# Patient Record
Sex: Female | Born: 1945 | Race: White | Hispanic: No | Marital: Married
Health system: Southern US, Community
[De-identification: ages and names within clinical notes are randomized; demographics above are authoritative.]

---

## 2000-12-23 ENCOUNTER — Other Ambulatory Visit: Admission: RE | Admit: 2000-12-23 | Discharge: 2000-12-23 | Payer: Self-pay | Admitting: Obstetrics and Gynecology

## 2000-12-25 ENCOUNTER — Ambulatory Visit (HOSPITAL_COMMUNITY): Admission: RE | Admit: 2000-12-25 | Discharge: 2000-12-25 | Payer: Self-pay | Admitting: Obstetrics and Gynecology

## 2000-12-25 ENCOUNTER — Encounter: Payer: Self-pay | Admitting: Obstetrics and Gynecology

## 2001-12-28 ENCOUNTER — Ambulatory Visit (HOSPITAL_COMMUNITY): Admission: RE | Admit: 2001-12-28 | Discharge: 2001-12-28 | Payer: Self-pay | Admitting: Obstetrics and Gynecology

## 2001-12-28 ENCOUNTER — Encounter: Payer: Self-pay | Admitting: Obstetrics and Gynecology

## 2001-12-30 ENCOUNTER — Encounter: Payer: Self-pay | Admitting: Obstetrics and Gynecology

## 2001-12-30 ENCOUNTER — Ambulatory Visit (HOSPITAL_COMMUNITY): Admission: RE | Admit: 2001-12-30 | Discharge: 2001-12-30 | Payer: Self-pay | Admitting: Obstetrics and Gynecology

## 2002-07-18 ENCOUNTER — Ambulatory Visit (HOSPITAL_COMMUNITY): Admission: RE | Admit: 2002-07-18 | Discharge: 2002-07-18 | Payer: Self-pay | Admitting: Obstetrics and Gynecology

## 2002-07-18 ENCOUNTER — Encounter: Payer: Self-pay | Admitting: Obstetrics and Gynecology

## 2003-01-18 ENCOUNTER — Encounter: Payer: Self-pay | Admitting: Obstetrics and Gynecology

## 2003-01-18 ENCOUNTER — Ambulatory Visit (HOSPITAL_COMMUNITY): Admission: RE | Admit: 2003-01-18 | Discharge: 2003-01-18 | Payer: Self-pay | Admitting: Obstetrics and Gynecology

## 2004-03-14 ENCOUNTER — Ambulatory Visit (HOSPITAL_COMMUNITY): Admission: RE | Admit: 2004-03-14 | Discharge: 2004-03-14 | Payer: Self-pay | Admitting: Obstetrics and Gynecology

## 2005-03-20 ENCOUNTER — Ambulatory Visit (HOSPITAL_COMMUNITY): Admission: RE | Admit: 2005-03-20 | Discharge: 2005-03-20 | Payer: Self-pay | Admitting: Vascular Surgery

## 2006-04-17 ENCOUNTER — Ambulatory Visit (HOSPITAL_COMMUNITY): Admission: RE | Admit: 2006-04-17 | Discharge: 2006-04-17 | Payer: Self-pay | Admitting: Obstetrics and Gynecology

## 2007-05-11 ENCOUNTER — Ambulatory Visit (HOSPITAL_COMMUNITY): Admission: RE | Admit: 2007-05-11 | Discharge: 2007-05-11 | Payer: Self-pay | Admitting: Obstetrics and Gynecology

## 2008-05-11 ENCOUNTER — Ambulatory Visit (HOSPITAL_COMMUNITY): Admission: RE | Admit: 2008-05-11 | Discharge: 2008-05-11 | Payer: Self-pay | Admitting: Obstetrics and Gynecology

## 2008-05-18 ENCOUNTER — Encounter: Admission: RE | Admit: 2008-05-18 | Discharge: 2008-05-18 | Payer: Self-pay | Admitting: Obstetrics and Gynecology

## 2009-05-21 ENCOUNTER — Ambulatory Visit (HOSPITAL_COMMUNITY): Admission: RE | Admit: 2009-05-21 | Discharge: 2009-05-21 | Payer: Self-pay | Admitting: Obstetrics and Gynecology

## 2010-06-04 ENCOUNTER — Ambulatory Visit (HOSPITAL_COMMUNITY): Admission: RE | Admit: 2010-06-04 | Discharge: 2010-06-04 | Payer: Self-pay | Admitting: Obstetrics and Gynecology

## 2010-09-30 ENCOUNTER — Encounter: Payer: Self-pay | Admitting: Obstetrics and Gynecology

## 2011-01-11 ENCOUNTER — Emergency Department (HOSPITAL_COMMUNITY): Payer: BC Managed Care – PPO

## 2011-01-11 ENCOUNTER — Observation Stay (HOSPITAL_COMMUNITY)
Admission: EM | Admit: 2011-01-11 | Discharge: 2011-01-14 | DRG: 395 | Disposition: A | Discharge status: Home / Self Care | Payer: BC Managed Care – PPO | Attending: Internal Medicine | Admitting: Internal Medicine

## 2011-01-11 DIAGNOSIS — R609 Edema, unspecified: Secondary | ICD-10-CM | POA: Insufficient documentation

## 2011-01-11 DIAGNOSIS — K2970 Gastritis, unspecified, without bleeding: Secondary | ICD-10-CM | POA: Insufficient documentation

## 2011-01-11 DIAGNOSIS — R259 Unspecified abnormal involuntary movements: Secondary | ICD-10-CM | POA: Insufficient documentation

## 2011-01-11 DIAGNOSIS — I498 Other specified cardiac arrhythmias: Secondary | ICD-10-CM | POA: Insufficient documentation

## 2011-01-11 DIAGNOSIS — K2990 Gastroduodenitis, unspecified, without bleeding: Secondary | ICD-10-CM | POA: Insufficient documentation

## 2011-01-11 DIAGNOSIS — D649 Anemia, unspecified: Secondary | ICD-10-CM

## 2011-01-11 DIAGNOSIS — K648 Other hemorrhoids: Secondary | ICD-10-CM | POA: Insufficient documentation

## 2011-01-11 DIAGNOSIS — R0602 Shortness of breath: Secondary | ICD-10-CM | POA: Insufficient documentation

## 2011-01-11 DIAGNOSIS — R002 Palpitations: Secondary | ICD-10-CM | POA: Insufficient documentation

## 2011-01-11 DIAGNOSIS — D509 Iron deficiency anemia, unspecified: Principal | ICD-10-CM | POA: Insufficient documentation

## 2011-01-11 LAB — LACTATE DEHYDROGENASE: LDH: 200 U/L (ref 94–250)

## 2011-01-11 LAB — HEPATIC FUNCTION PANEL
Albumin: 4.2 g/dL (ref 3.5–5.2)
Indirect Bilirubin: 0.2 mg/dL — ABNORMAL LOW (ref 0.3–0.9)
Total Bilirubin: 0.3 mg/dL (ref 0.3–1.2)

## 2011-01-11 LAB — BASIC METABOLIC PANEL
Calcium: 9.8 mg/dL (ref 8.4–10.5)
Creatinine, Ser: 0.49 mg/dL (ref 0.4–1.2)
GFR calc Af Amer: >60 mL/min (ref >60)

## 2011-01-11 LAB — CBC
MCH: 15.1 pg — ABNORMAL LOW (ref 26.0–34.0)
MCHC: 26 g/dL — ABNORMAL LOW (ref 30.0–36.0)
Platelets: 562 10*3/uL — ABNORMAL HIGH (ref 150–400)
RDW: 21.6 % — ABNORMAL HIGH (ref 11.5–15.5)

## 2011-01-11 LAB — DIFFERENTIAL
Basophils Relative: 1 % (ref 0–1)
Eosinophils Absolute: 0.1 10*3/uL (ref 0.0–0.7)
Eosinophils Relative: 1 % (ref 0–5)
Monocytes Absolute: 0.6 10*3/uL (ref 0.1–1.0)
Monocytes Relative: 8 % (ref 3–12)
Neutrophils Relative %: 68 % (ref 43–77)

## 2011-01-11 LAB — MAGNESIUM: Magnesium: 2.5 mg/dL (ref 1.5–2.5)

## 2011-01-11 LAB — POCT CARDIAC MARKERS: Myoglobin, poc: 25.5 ng/mL (ref 12–200)

## 2011-01-12 LAB — IRON AND TIBC

## 2011-01-12 LAB — HAPTOGLOBIN: Haptoglobin: 104 mg/dL (ref 16–200)

## 2011-01-12 LAB — DIFFERENTIAL
Basophils Absolute: 0.1 10*3/uL (ref 0.0–0.1)
Basophils Relative: 1 % (ref 0–1)
Eosinophils Absolute: 0.1 10*3/uL (ref 0.0–0.7)
Eosinophils Relative: 3 % (ref 0–5)
Lymphocytes Relative: 33 % (ref 12–46)
Monocytes Absolute: 0.4 10*3/uL (ref 0.1–1.0)

## 2011-01-12 LAB — CBC
HCT: 32.4 % — ABNORMAL LOW (ref 36.0–46.0)
MCHC: 29 g/dL — ABNORMAL LOW (ref 30.0–36.0)
Platelets: 499 10*3/uL — ABNORMAL HIGH (ref 150–400)
RDW: 25.1 % — ABNORMAL HIGH (ref 11.5–15.5)
WBC: 4.9 10*3/uL (ref 4.0–10.5)

## 2011-01-12 LAB — BASIC METABOLIC PANEL
Calcium: 9.9 mg/dL (ref 8.4–10.5)
GFR calc Af Amer: >60 mL/min (ref >60)
GFR calc non Af Amer: >60 mL/min (ref >60)
Potassium: 3.9 mEq/L (ref 3.5–5.1)
Sodium: 140 mEq/L (ref 135–145)

## 2011-01-12 LAB — CK TOTAL AND CKMB (NOT AT ARMC)
CK, MB: 1.4 ng/mL (ref 0.3–4.0)
Relative Index: INVALID (ref 0.0–2.5)
Total CK: 53 U/L (ref 7–177)

## 2011-01-12 LAB — FERRITIN: Ferritin: 1 ng/mL — ABNORMAL LOW (ref 10–291)

## 2011-01-12 LAB — TSH: TSH: 2.216 u[IU]/mL (ref 0.350–4.500)

## 2011-01-13 ENCOUNTER — Other Ambulatory Visit: Payer: Self-pay | Admitting: Gastroenterology

## 2011-01-13 DIAGNOSIS — K648 Other hemorrhoids: Secondary | ICD-10-CM

## 2011-01-13 DIAGNOSIS — R002 Palpitations: Secondary | ICD-10-CM

## 2011-01-13 DIAGNOSIS — K2940 Chronic atrophic gastritis without bleeding: Secondary | ICD-10-CM

## 2011-01-13 DIAGNOSIS — D509 Iron deficiency anemia, unspecified: Secondary | ICD-10-CM

## 2011-01-13 LAB — CBC
MCV: 63.7 fL — ABNORMAL LOW (ref 78.0–100.0)
Platelets: 434 10*3/uL — ABNORMAL HIGH (ref 150–400)
RBC: 4.91 MIL/uL (ref 3.87–5.11)
RDW: 25.5 % — ABNORMAL HIGH (ref 11.5–15.5)
WBC: 4.6 10*3/uL (ref 4.0–10.5)

## 2011-01-13 LAB — MAGNESIUM: Magnesium: 2.1 mg/dL (ref 1.5–2.5)

## 2011-01-13 LAB — BASIC METABOLIC PANEL
Chloride: 103 mEq/L (ref 96–112)
Potassium: 3.8 mEq/L (ref 3.5–5.1)
Sodium: 138 mEq/L (ref 135–145)

## 2011-01-13 LAB — DIFFERENTIAL
Basophils Absolute: 0.1 10*3/uL (ref 0.0–0.1)
Basophils Relative: 1 % (ref 0–1)
Eosinophils Absolute: 0.1 10*3/uL (ref 0.0–0.7)
Eosinophils Relative: 2 % (ref 0–5)
Lymphs Abs: 1.5 10*3/uL (ref 0.7–4.0)
Neutrophils Relative %: 52 % (ref 43–77)

## 2011-01-13 LAB — HEMOCCULT GUIAC POC 1CARD (OFFICE): Fecal Occult Bld: NEGATIVE

## 2011-01-13 LAB — TROPONIN I: Troponin I: <0.3 ng/mL (ref <0.30)

## 2011-01-14 LAB — LIPID PANEL
Cholesterol: 156 mg/dL (ref 0–200)
LDL Cholesterol: 69 mg/dL (ref 0–99)
Triglycerides: 43 mg/dL (ref <150)
VLDL: 9 mg/dL (ref 0–40)

## 2011-01-15 LAB — CROSSMATCH
ABO/RH(D): A POS
Antibody Screen: NEGATIVE
Unit division: 0

## 2011-01-16 NOTE — Progress Notes (Signed)
Cc to Dr. Ferguson 

## 2011-01-17 ENCOUNTER — Encounter: Payer: BC Managed Care – PPO | Admitting: Gastroenterology

## 2011-01-17 ENCOUNTER — Ambulatory Visit (HOSPITAL_COMMUNITY)
Admission: RE | Admit: 2011-01-17 | Discharge: 2011-01-17 | Disposition: A | Discharge status: Home / Self Care | Payer: BC Managed Care – PPO | Source: Ambulatory Visit | Attending: Gastroenterology | Admitting: Gastroenterology

## 2011-01-17 DIAGNOSIS — D509 Iron deficiency anemia, unspecified: Secondary | ICD-10-CM | POA: Insufficient documentation

## 2011-01-17 DIAGNOSIS — K633 Ulcer of intestine: Secondary | ICD-10-CM | POA: Insufficient documentation

## 2011-01-17 NOTE — Progress Notes (Signed)
No PCP on record 

## 2011-01-17 NOTE — H&P (Signed)
[name], Farmer NO.:  [id number]  MEDICAL RECORD NO.:  [id number]           PATIENT TYPE:  O  LOCATION:  A219                          FACILITY:  [facility]  PHYSICIAN:  Vania Rea, M.D. DATE OF BIRTH:  May 08, 1946  DATE OF ADMISSION:  01/11/2011 DATE OF DISCHARGE:  LH                             HISTORY & PHYSICAL   PRIMARY CARE PHYSICIAN:  Unassigned.  OBSTETRIC AND GYNECOLOGIC DOCTOR:  Vanessa P. Haygood, MD  CHIEF COMPLAINT:  Palpitations with exertion, episodically for 1 year, much worse today.  HISTORY OF PRESENT ILLNESS:  This is a 65 year old Caucasian lady who considers herself to be in very good health.  Does aerobic exercises for about an hour each day, but has not had any medical followup for about 35 years.  The patient says she had a partial thyroidectomy 35 years ago at [facility] and that is the last time when a blood work has been done on her.  She does however get regular Pap smears and regular mammograms, all of which have been negative.  She denies any history of anemia.  The patient says she first noticed severe palpitations while hiking in [place] about May 2011 and noticed it going up an incline which she thought was strange.  She never has any unusual shortness of breath or palpitations during her aerobic exercises.  For the past 2 weeks, she has noticed palpitations and lightheadedness when climbing stairs but not when walking on an incline and today at a birthday party in [place], the symptoms got much worse and she decided to come to the local emergency room to be evaluated.  She has not noticed any orthopnea, but she has been having episodic lower extremity edema for at least a few months.  It was very bad for about 3 days 2 weeks ago, but she felt it was due to a water deficiency. Drank a lot of water and it appeared to go away.  She has had no frank shortness of breath.  She has had no chest pain.  She denies any  nausea or vomiting.  She denies passage of bloody or black stool.  For the past year, she has been taking two 325 mg aspirins every morning before exercise and about 6 months ago, she suffered a fracture of the foot and took Aleve for 6 weeks.  That foot fracture was managed out at [facility] in [place] and [facility] in [place], but no blood work was necessary for successful management.  The patient reports that she deliberately lost 60 pounds 3 years ago using diet and exercise and her weight has been pretty steady since except, but over the past 6-8 months she noticed she has gained about 10 pounds.  PAST MEDICAL HISTORY:  Status post thyroidectomy as noted above.  MEDICATIONS:  Aspirin 650 mg daily.  ALLERGIES:  To PENICILLIN.  SOCIAL HISTORY:  She denies tobacco, alcohol or illicit drug use. Exercises for an hour daily.  She is self-employed, Marine scientist.  She is menopause at about age [age] and has had no postmenopausal bleeding.  FAMILY HISTORY:  Significant for mother  and sister with breast cancer. Her mother was diagnosed at age 97 and died at age 23.  Her father had some sorts of aortic stenting because of blockage of the aorta.  The details are unclear.  He lived to be age 53.  No other known medical problems.  She has a brother who is a retired Dispensing optician who suffers with hypertension.  REVIEW OF SYSTEMS:  Other than noted above, unremarkable.  PHYSICAL EXAMINATION:  GENERAL:  A very pleasant and healthy-looking middle-aged Caucasian lady reclining in the bed not distressed.  Healthy appearance, probably enhance by the fact that she is evenly toned. VITAL SIGNS:  Her temperature is 98.1, pulse 68, respirations 20, blood pressure 129/55.  She is saturating 100% on 2 L. HEENT:  Her pupils are round equal and reactive.  Mucous membranes are pale.  Anicteric. NECK:  No cervical lymphadenopathy.  No thyromegaly or carotid bruit. No jugular  venous distention. CHEST:  Clear to auscultation bilaterally. CARDIOVASCULAR:  Regular rhythm.  She does have a prominent S1, but there is no murmur. ABDOMEN:  Scaphoid, soft, nontender.  No masses.  Normal abdominal bowel sounds. EXTREMITIES:  She has a trace edema bilaterally.  Dorsalis pedis pulses are 2+ bilaterally. CENTRAL NERVOUS SYSTEM:  Cranial nerves II-XII are grossly intact.  She has no lateralizing signs. SKIN:  Evenly toned.  There is no ulceration.  LABORATORY FINDINGS:  Her white count is 7.1, hemoglobin 5.8, red cell count 3.84 which is only slightly diminished, hematocrit is 22.3, MCV is 58, her platelet count is elevated at 563.  White count differential is unremarkable.  Her sodium is low at 134, potassium 3.8, chloride 99, CO2 28, glucose 97, BUN 15, creatinine 0.49, calcium 9.8.  Her cardiac markers are completely normal with undetectable CK-MB and troponin and myoglobin of 25.  Her EKG shows a normal sinus rhythm with nonspecific T- wave changes, predominantly T-waves inversions and her chest x-ray shows no acute abnormality.  She has a normal-sized heart and clear lungs.  ASSESSMENT: 1. Severe microcytic anemia, probably chronic, probably due to chronic     blood loss but nutritional deficiency also a possibility 2. Palpitations.  Likely due to chronic myocardial ischemia secondary     to anemia. 3. Abnormal movements of lower extremity, possibly restless leg     syndrome, possibly neuropathy related to neuronal ischemia,     possibly paraneoplastic disease.  PLAN:  We will admit this lady for anemia workup and transfusion. Although her stool vault was empty, therefore was not able to get any stool for Hemoccult.  We will check stool Hemoccults when becomes available, but she will likely need gastrointestinal workup as and in or outpatient. Differential does include chronic blood loss associated with aspirin use versus chronic blood loss from upper or  lower GI abnormality.  She has never been told she has a familial anemia.     Vania Rea, M.D.     LC/MEDQ  D:  01/11/2011  T:  01/11/2011  Job:  [id number]  cc:   Sunnie Nielsen, MD  Electronically Signed by Vania Rea M.D. on 01/17/2011 11:16:29 PM

## 2011-01-21 ENCOUNTER — Encounter: Payer: BC Managed Care – PPO | Admitting: Gastroenterology

## 2011-01-23 ENCOUNTER — Telehealth: Payer: Self-pay | Admitting: Gastroenterology

## 2011-01-23 NOTE — Telephone Encounter (Addendum)
Spoke with pt. Discussed capsule results. Pt has duodenal ulcers 2o to ASA. Avoid ASA unless medically necessary. Reviewed EGD/TCS results and path-GASTRITIS 2o to ASA, nl duodenum. OPV in 4 mos, RE: anemia.  Cell ph#: [phone]

## 2011-01-23 NOTE — Telephone Encounter (Signed)
Reminder in epic to follow up in 4 months with SF for anemia

## 2011-01-28 NOTE — Discharge Summary (Signed)
Sabrina Farmer, Sabrina Farmer              ACCOUNT NO.:  [id number]  MEDICAL RECORD NO.:  [id number]           PATIENT TYPE:  I  LOCATION:  A219                          FACILITY:  [facility]  PHYSICIAN:  Mendel Binsfeld L. Lendell Caprice, MDDATE OF BIRTH:  1946-06-17  DATE OF ADMISSION:  01/11/2011 DATE OF DISCHARGE:  05/08/2012LH                              DISCHARGE SUMMARY   DISCHARGE DIAGNOSES: 1. Severe iron-deficiency anemia without signs of bleeding, possibly     due to diet, capsule endoscopy to be done as an outpatient. 2. Palpitations. 3. Sinus bradycardia.  DISCHARGE MEDICATIONS:  Stop aspirin in 2 weeks after capsule endoscopy, start iron sulfate 325 mg p.o. b.i.d., continue multivitamin a day.  CONDITION:  Stable.  ACTIVITY:  Ad lib.  DIET:  High iron.  CONSULTATIONS:  Dr. Darrick Penna.  PROCEDURES:  EGD, which showed mild gastritis likely secondary to aspirin use, status post biopsy for H. pylori.  CONDITION:  Stable.  FOLLOWUP:  She will have an outpatient capsule endoscopy within the next week, which will be followed up by Dr. Darrick Penna.  Biopsy results to be followed up by Dr. Darrick Penna.  The patient will establish care with Dr. Sherwood Gambler as her primary care physician.  She will need ongoing monitoring of her hemoglobin.  LABS ON ADMISSION:  Hemoglobin was 5.8, hematocrit 22, MCV 58, RDW 21, platelet count 562, normal white count, and differential.  At discharge after transfusion of 2 units packed red blood cells, her hemoglobin was 8.8, hematocrit 31.3.  Complete metabolic panel normal.  Cardiac enzymes normal.  LDL 69, HDL 78, triglycerides 43.  Total cholesterol 156.  TSH 2.216, free T4 of 1.0.  Ferritin was 1.  Iron less than 10.  B12 and folate were unremarkable.  Haptoglobin normal.  Hemoccult of the stool negative.Marland Kitchen  DIAGNOSTICS:  Chest x-ray showed nothing acute.  EKG showed normal sinus rhythm.  Echocardiogram was normal.  HISTORY AND HOSPITAL COURSE:  Please see H and P for  details.  Sabrina Farmer is a 65 year old physically active white female who presented with palpitations with exertion.  She also had leg jerking at night. She denied any bleeding.  She has never had colonoscopy.  The patient had heme-negative stool, but was found to have a hemoglobin of 5.  She was admitted for workup.  She remained in sinus rhythm both normal sinus and sinus bradycardia, which was asymptomatic.  She was transfused.  GI was consulted and endoscopy results as above.  The patient's husband apparently discussed with one of the physicians that the patient may have an eating disorder.  She will have a capsule endoscopy, but her iron deficiency may be due to dietary deficiency.  She will establish a primary care physician for outpatient followup.  Her leg symptoms resolved after transfusion and she had no further palpitations.  Her echocardiogram was unremarkable and her other workup was negative.  She was given dietary teaching and a dietitian consult will be obtained prior to her discharge.     Jon Kasparek L. Lendell Caprice, MD    CLS/MEDQ  D:  01/14/2011  T:  01/14/2011  Job:  [id number]  cc:  Madelin Rear. Sherwood Gambler, MD Fax: [phone]  Hal Morales, M.D. Fax: [phone]  Electronically Signed by Crista Curb MD on 01/28/2011 10:59:45 AM

## 2011-02-05 NOTE — Op Note (Signed)
  NAMERIELLY, BRUNN NO.:  [id number]  MEDICAL RECORD NO.:  [id number]           PATIENT TYPE:  O  LOCATION:  DAYP                          FACILITY:  [facility]  PHYSICIAN:  Jonette Eva, M.D.     DATE OF BIRTH:  1946/01/11  DATE OF PROCEDURE:  01/17/2011 DATE OF DISCHARGE:  01/17/2011                              OPERATIVE REPORT   PROCEDURE:  Small bowel capsule endoscopy.  PHYSICIAN COSIGNING NOTE:  Jonette Eva, MD  INDICATIONS FOR PROCEDURE:  Sabrina Farmer is a 65 year old lady who presented to the hospital on Jan 11, 2011, with profound iron deficiency anemia.  Her ferritin was 1.  Her hemoglobin was 6.8 with MCV of 58.1 upon admission.  She was transfused and had appropriate response.  She underwent an EGD and colonoscopy on Jan 13, 2011, which showed a normal terminal ileum, approximately 10 cm was visualized, slightly tortuous colon, small internal hemorrhoids, linear erosions seen in the antrum, biopsies were negative for H. pylori or malignancy, ampulla was not visualized, but duodenum otherwise appeared normal through the second portion.  Biopsies were negative for celiac disease.  The patient had been using Aleve in November and December due to right foot fracture. Procedure being done to further evaluate profound iron deficiency anemia.  PROCEDURE:  The patient swallowed capsule without any difficulty.  The first gastric image was at 31 seconds.  First duodenal image at 16 minutes and 10 seconds.  Small bowel passage time was 3 hours and 48 minutes.  She was noted to have a large ulceration without any active bleeding at 60 minutes and 21 seconds.  Otherwise, small bowel mucosa was unremarkable.  First ileocecal valve at 4 hours 4 minutes and 39 seconds with first cecal image at 4 hours 4 minutes 43 seconds.  RECOMMENDATIONS AND FINDINGS:  Large nonbleeding proximal small bowel ulceration.  History of Aleve use for 6 weeks due to the ankle fracture  AND DAILY ASA USE, likely the culprit.  Recommend the patient be on iron supplementation as recommended at the time of her discharge from hospital. OPV IN 4 MOS.     Tana Coast, P.A.   ______________________________ Jonette Eva, M.D.    LL/MEDQ  D:  01/23/2011  T:  01/24/2011  Job:  [id number]  cc:   Madelin Rear. Sherwood Gambler, MD Fax: [phone]  Electronically Signed by Tana Coast P.A. on 01/24/2011 02:49:39 PM Electronically Signed by Jonette Eva M.D. on 02/05/2011 01:52:18 PM

## 2011-02-05 NOTE — Consult Note (Signed)
NAMEALECE, [name]:  [id number]  MEDICAL RECORD NO.:  [id number]           PATIENT TYPE:  O  LOCATION:  A219                          FACILITY:  [facility]  PHYSICIAN:  Jonette Eva, M.D.     DATE OF BIRTH:  [birth year]  DATE OF CONSULTATION:  01/13/2011 DATE OF DISCHARGE:                                CONSULTATION   REFERRING PHYSICIAN:  Elliot Cousin, MD  PRIMARY CARE PHYSICIAN:  Madelin Rear. Sherwood Gambler, MD  REASON FOR CONSULTATION:  Microcytic anemia.  HISTORY OF PRESENT ILLNESS:  Sabrina Farmer is a 65 year old female who is very active.  She noted in May 2011 that she was short of breath when she was hiking with her son in [place].  She was surprised because she exercises on a regular basis.  She continued to follow her usual routine and noted that she had swollen ankles last week when she was sitting around at work in [employer].  She states she drank some water and it resolved.  She exercises 5 a.m. on Friday morning and did not have chest pain or shortness of breath, but she did feel lightheaded and so she came to the emergency department to make sure that there was nothing wrong with her heart.  She had periodically been feeling lightheaded with changes in position.  She denies any blood in her stool, black stool, change in her bowel habits.  She does use aspirin once daily.  She never had a colonoscopy.  She rarely uses Prilosec for heartburn.  She had an intentional 60-pound weight loss. She has no problems swallowing.  She is up-to-date with her mammograms. She did use Aleve in November or December 2011 due to right foot fracture.  She does not drink any alcohol.  She has a bowel movement every 2-3 days.  She does eat fiber including green beans and broccoli.  PAST MEDICAL HISTORY:  Thyroid nodule in her 45s.  PAST SURGICAL HISTORY:  Subtotal thyroidectomy.  ALLERGIES:  PENICILLIN and SHELLFISH.  MEDICATIONS: 1. Tylenol as needed. 2.  Zofran as needed. 3. Senna as needed.  FAMILY HISTORY:  She has no family history of colon cancer, colon polyps or gastric cancer.  She does have family history of breast cancer and atherosclerotic disease of the aorta.  SOCIAL HISTORY:  She is married for the last 20 years.  She has 5 adopted children.  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  REVIEW OF SYSTEMS:  Per the HPI, otherwise all systems are negative.  PHYSICAL EXAMINATION:  Vital Signs:  T-max 98.2, systolic blood pressure 130-101, heart rate 49-68, O2 sat 100% on 2 L nasal cannula. GENERAL:  She is in no apparent distress, alert and oriented x4. HEENT:  Atraumatic, normocephalic.  Pupils equal and react to light. Mouth, no oral lesions.  Posterior pharynx without erythema or exudate. NECK:  Full range of motion.  No lymphadenopathy. LUNGS:  Clear to auscultation bilaterally. CARDIOVASCULAR:  Regular rhythm.  No murmur.  Normal S1 and S2. ABDOMEN:  Bowel sounds are present, soft, nontender, nondistended.  No rebound or guarding. EXTREMITIES:  No cyanosis or edema. NEURO:  She has no focal neurologic deficits.  LABORATORY DATA:  White count 4.9, hemoglobin 5.8 to 9.4, platelets 499, MCV 58.1, creatinine 0.49, normal hepatic function panel, albumin 4.2, pending anemia panel.  RADIOGRAPHIC STUDIES:  Chest x-ray showed no acute cardiopulmonary disease.  ASSESSMENT:  Sabrina Farmer is a 65 year old female with profound microcytic anemia.  The differential diagnosis includes colon polyps, colon cancer and less likely gastric cancer or arteriovenous malformations in the stomach, small bowel or colon.  She does have asymptomatic bradycardia.  She has a known paradoxical reaction to sedation.  RECOMMENDATIONS: 1. We will prep for colonoscopy, possible EGD tomorrow with propofol.     The propofol is being used due to her history of  paradoxical     reaction to sedation as well as asymptomatic bradycardia. 2. Protonix for GI prophylaxis.  Thank you for allowing me to see [name] in consultation.  ADDENDUM: Pt's husband asked that we not tell his wife that he thinks she has an eating disorder.   Jonette Eva, M.D.     SF/MEDQ  D:  01/12/2011  T:  01/12/2011  Job:  [id number]  cc:   Madelin Rear. Sherwood Gambler, MD Fax: [phone]  Electronically Signed by Jonette Eva M.D. on 02/05/2011 01:49:00 PM

## 2011-02-05 NOTE — Op Note (Signed)
NAMEANTONIETA, Sabrina Farmer NO.:  [id number]  MEDICAL RECORD NO.:  [id number]           PATIENT TYPE:  O  LOCATION:  A219                          FACILITY:  [facility]  PHYSICIAN:  Jonette Eva, M.D.     DATE OF BIRTH:  August 08, 1946  DATE OF PROCEDURE:  01/13/2011 DATE OF DISCHARGE:                              OPERATIVE REPORT   REFERRING PROVIDER:  Dr. Sherrie Mustache.  PRIMARY PHYSICIAN:  Madelin Rear. Sherwood Gambler, MD  PROCEDURE: 1. Ileocolonoscopy. 2. Esophagogastroduodenoscopy with cold forceps biopsy of the gastric and     duodenal mucosa.  INDICATION FOR EXAM:  Sabrina Farmer is a 65 year old female who presents with profound iron deficiency anemia.  A ferritin is 1 and her hemoglobin was 6.8 with an MCV of 58.1 upon admission.  She had appropriate transfusion response.  Her husband states that she has always on a diet and uses laxatives.  He does not want [place] to inform her that he is given that this piece of information.  FINDINGS: 1. Normal terminal ileum approximately 10 cm visualized. 2. Normal colon without evidence of polyps, masses, inflammatory     change, diverticular AVMs.  Slightly tortuous. 3. Small internal hemorrhoids.  Otherwise normal rectum. 4. Normal esophagus without evidence of Barrett's mass, erosion,     ulceration or stricture. 5. Linear erosions seen in the antrum.  Biopsies obtained via cold     forceps to evaluate for H. pylori gastritis. 6. Normal duodenal bulb in the second portion of the duodenum.  Unable     to assess the ampulla with the forward-viewing scope.  DIAGNOSES: 1. Iron deficiency anemia most likely secondary to poor dietary     intake/eating disorder.  No obvious source for iron deficiency     anemia identified.  The differential diagnosis includes small bowel     tumor or small bowel ulcers, or AVMs. 2. Mild gastritis likely secondary to aspirin use. 3. Small internal hemorrhoids.  RECOMMENDATIONS: 1. She should be started on iron  in 2 weeks as an outpatient.  The     iron should be held until her capsule endoscopy is complete. 2. She should follow iron diet.  Nutrition consult for instruction. 3. We will await biopsies to evaluate for celiac sprue as an etiology     for iron deficiency anemia. 4. She should avoid aspirin unless it is medically necessary. 5. We will schedule capsule endoscopy within the next week. 6. The patient is stable.  She may be discharged home tomorrow. 7. Screening colonoscopy in 10 years.  MEDICATIONS:  Propofol provided by anesthesia.  PROCEDURE TECHNIQUE:  Physical exam was performed.  Informed consent was obtained from the patient explaining the benefits, risks and alternatives to the procedure.  The patient was connected to monitor and placed in left lateral position.  Continuous oxygen was provided by nasal cannula, IV medicine administered through an indwelling cannula. After administration of sedation and rectal exam, the patient's rectum and scope was advanced under direct visualization to the distal terminal ileum.  The scope was removed slowly by carefully examining of the color, texture, anatomy, and integrity of the  mucosa on the way out.  After colonoscopy, the patient's esophagus was intubated with the diagnostic gastroscope.  The scope was advanced under direct visualization to the second portion of the duodenum.  Scope was removed slowly by careful examine of the color, texture, anatomy and integrity mucosa on the way out.  The patient was recovered in endoscopy and discharged to the floor in satisfactory condition.  I discussed these findings and the plan with her husband and Dr. Lendell Caprice.  PATH: GASTRITIS 2o to ASA, NL DUODENUM   Jonette Eva, M.D.     SF/MEDQ  D:  01/13/2011  T:  01/13/2011  Job:  [id number]  cc:   Madelin Rear. Sherwood Gambler, MD Fax: [phone]  Electronically Signed by Jonette Eva M.D. on 02/05/2011 01:50:40 PM

## 2011-05-01 ENCOUNTER — Other Ambulatory Visit (HOSPITAL_COMMUNITY): Payer: Self-pay | Admitting: Obstetrics and Gynecology

## 2011-05-01 DIAGNOSIS — Z139 Encounter for screening, unspecified: Secondary | ICD-10-CM

## 2011-06-06 ENCOUNTER — Ambulatory Visit (HOSPITAL_COMMUNITY)
Admission: RE | Admit: 2011-06-06 | Discharge: 2011-06-06 | Disposition: A | Discharge status: Home / Self Care | Payer: BC Managed Care – PPO | Source: Ambulatory Visit | Attending: Obstetrics and Gynecology | Admitting: Obstetrics and Gynecology

## 2011-06-06 DIAGNOSIS — Z1231 Encounter for screening mammogram for malignant neoplasm of breast: Secondary | ICD-10-CM | POA: Insufficient documentation

## 2011-06-06 DIAGNOSIS — Z139 Encounter for screening, unspecified: Secondary | ICD-10-CM

## 2012-05-04 ENCOUNTER — Other Ambulatory Visit (HOSPITAL_COMMUNITY): Payer: Self-pay | Admitting: Internal Medicine

## 2012-05-04 DIAGNOSIS — Z139 Encounter for screening, unspecified: Secondary | ICD-10-CM

## 2012-06-11 ENCOUNTER — Ambulatory Visit (HOSPITAL_COMMUNITY)
Admission: RE | Admit: 2012-06-11 | Discharge: 2012-06-11 | Disposition: A | Discharge status: Home / Self Care | Payer: BC Managed Care – PPO | Source: Ambulatory Visit | Attending: Internal Medicine | Admitting: Internal Medicine

## 2012-06-11 ENCOUNTER — Other Ambulatory Visit: Payer: Self-pay | Admitting: Obstetrics and Gynecology

## 2012-06-11 DIAGNOSIS — Z139 Encounter for screening, unspecified: Secondary | ICD-10-CM

## 2012-06-11 DIAGNOSIS — R928 Other abnormal and inconclusive findings on diagnostic imaging of breast: Secondary | ICD-10-CM | POA: Insufficient documentation

## 2012-06-11 DIAGNOSIS — Z1231 Encounter for screening mammogram for malignant neoplasm of breast: Secondary | ICD-10-CM | POA: Insufficient documentation

## 2012-06-16 ENCOUNTER — Other Ambulatory Visit: Payer: Self-pay | Admitting: Obstetrics and Gynecology

## 2012-06-16 DIAGNOSIS — R928 Other abnormal and inconclusive findings on diagnostic imaging of breast: Secondary | ICD-10-CM

## 2012-06-30 ENCOUNTER — Ambulatory Visit
Admission: RE | Admit: 2012-06-30 | Discharge: 2012-06-30 | Disposition: A | Discharge status: Home / Self Care | Payer: BC Managed Care – PPO | Source: Ambulatory Visit | Attending: Obstetrics and Gynecology | Admitting: Obstetrics and Gynecology

## 2012-06-30 DIAGNOSIS — R928 Other abnormal and inconclusive findings on diagnostic imaging of breast: Secondary | ICD-10-CM

## 2012-07-05 ENCOUNTER — Encounter: Payer: Self-pay | Admitting: Obstetrics and Gynecology

## 2012-07-05 DIAGNOSIS — R921 Mammographic calcification found on diagnostic imaging of breast: Secondary | ICD-10-CM | POA: Insufficient documentation

## 2012-09-30 ENCOUNTER — Other Ambulatory Visit (HOSPITAL_COMMUNITY): Payer: Self-pay | Admitting: Internal Medicine

## 2012-09-30 ENCOUNTER — Ambulatory Visit (HOSPITAL_COMMUNITY)
Admission: RE | Admit: 2012-09-30 | Discharge: 2012-09-30 | Disposition: A | Discharge status: Home / Self Care | Payer: Medicare Other | Source: Ambulatory Visit | Attending: Internal Medicine | Admitting: Internal Medicine

## 2012-09-30 DIAGNOSIS — M79609 Pain in unspecified limb: Secondary | ICD-10-CM | POA: Insufficient documentation

## 2012-09-30 DIAGNOSIS — IMO0002 Reserved for concepts with insufficient information to code with codable children: Secondary | ICD-10-CM | POA: Insufficient documentation

## 2012-09-30 DIAGNOSIS — X58XXXA Exposure to other specified factors, initial encounter: Secondary | ICD-10-CM | POA: Insufficient documentation

## 2012-09-30 DIAGNOSIS — M25549 Pain in joints of unspecified hand: Secondary | ICD-10-CM

## 2012-11-29 ENCOUNTER — Other Ambulatory Visit: Payer: Self-pay | Admitting: Obstetrics and Gynecology

## 2012-11-29 DIAGNOSIS — R921 Mammographic calcification found on diagnostic imaging of breast: Secondary | ICD-10-CM

## 2013-02-02 ENCOUNTER — Other Ambulatory Visit: Payer: Self-pay | Admitting: Obstetrics and Gynecology

## 2013-02-02 ENCOUNTER — Ambulatory Visit (HOSPITAL_COMMUNITY)
Admission: RE | Admit: 2013-02-02 | Discharge: 2013-02-02 | Disposition: A | Discharge status: Home / Self Care | Payer: Medicare Other | Source: Ambulatory Visit | Attending: Obstetrics and Gynecology | Admitting: Obstetrics and Gynecology

## 2013-02-02 DIAGNOSIS — R921 Mammographic calcification found on diagnostic imaging of breast: Secondary | ICD-10-CM

## 2013-02-02 DIAGNOSIS — Z09 Encounter for follow-up examination after completed treatment for conditions other than malignant neoplasm: Secondary | ICD-10-CM | POA: Insufficient documentation

## 2013-02-02 DIAGNOSIS — R928 Other abnormal and inconclusive findings on diagnostic imaging of breast: Secondary | ICD-10-CM | POA: Insufficient documentation

## 2013-02-04 ENCOUNTER — Ambulatory Visit
Admission: RE | Admit: 2013-02-04 | Discharge: 2013-02-04 | Disposition: A | Discharge status: Home / Self Care | Payer: Medicare Other | Source: Ambulatory Visit | Attending: Obstetrics and Gynecology | Admitting: Obstetrics and Gynecology

## 2013-02-04 DIAGNOSIS — R921 Mammographic calcification found on diagnostic imaging of breast: Secondary | ICD-10-CM

## 2013-02-07 ENCOUNTER — Encounter: Payer: Self-pay | Admitting: Obstetrics and Gynecology

## 2013-02-07 DIAGNOSIS — R922 Inconclusive mammogram: Secondary | ICD-10-CM | POA: Insufficient documentation

## 2013-07-28 ENCOUNTER — Other Ambulatory Visit: Payer: Self-pay | Admitting: Obstetrics and Gynecology

## 2013-07-28 DIAGNOSIS — Z139 Encounter for screening, unspecified: Secondary | ICD-10-CM

## 2013-08-02 ENCOUNTER — Ambulatory Visit (HOSPITAL_COMMUNITY)
Admission: RE | Admit: 2013-08-02 | Discharge: 2013-08-02 | Disposition: A | Discharge status: Home / Self Care | Payer: Medicare Other | Source: Ambulatory Visit | Attending: Obstetrics and Gynecology | Admitting: Obstetrics and Gynecology

## 2013-08-02 DIAGNOSIS — Z139 Encounter for screening, unspecified: Secondary | ICD-10-CM

## 2013-08-02 DIAGNOSIS — Z1231 Encounter for screening mammogram for malignant neoplasm of breast: Secondary | ICD-10-CM | POA: Insufficient documentation

## 2013-10-07 IMAGING — MG MM DIGITAL DIAGNOSTIC UNILAT*L*
5 series · 5 of 5 positions shown · non-contrast
Comparison: 06/30/2012, 06/11/2012, 06/06/2011, 06/04/2010,
05/21/2009

CLINICAL DATA: The patient returns for short interval follow-up of
calcifications in the left upper outer quadrant.

DIGITAL DIAGNOSTIC LEFT MAMMOGRAM WITH CAD

[L CC (1 of 2)]
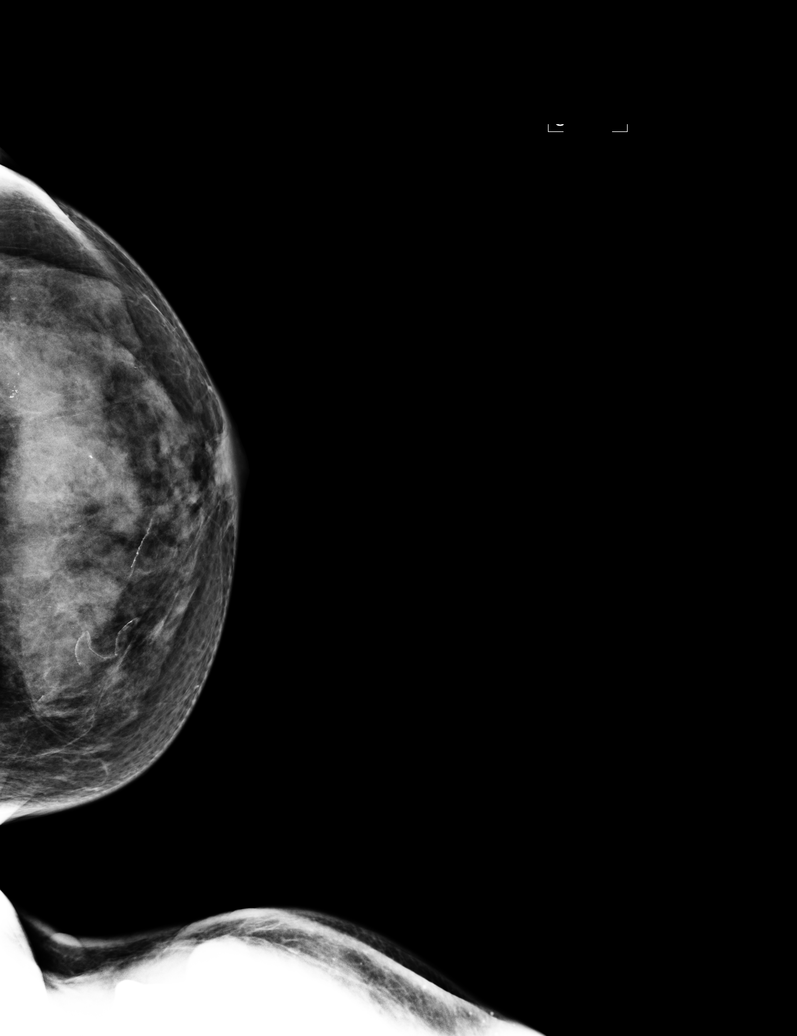

[L MLO]
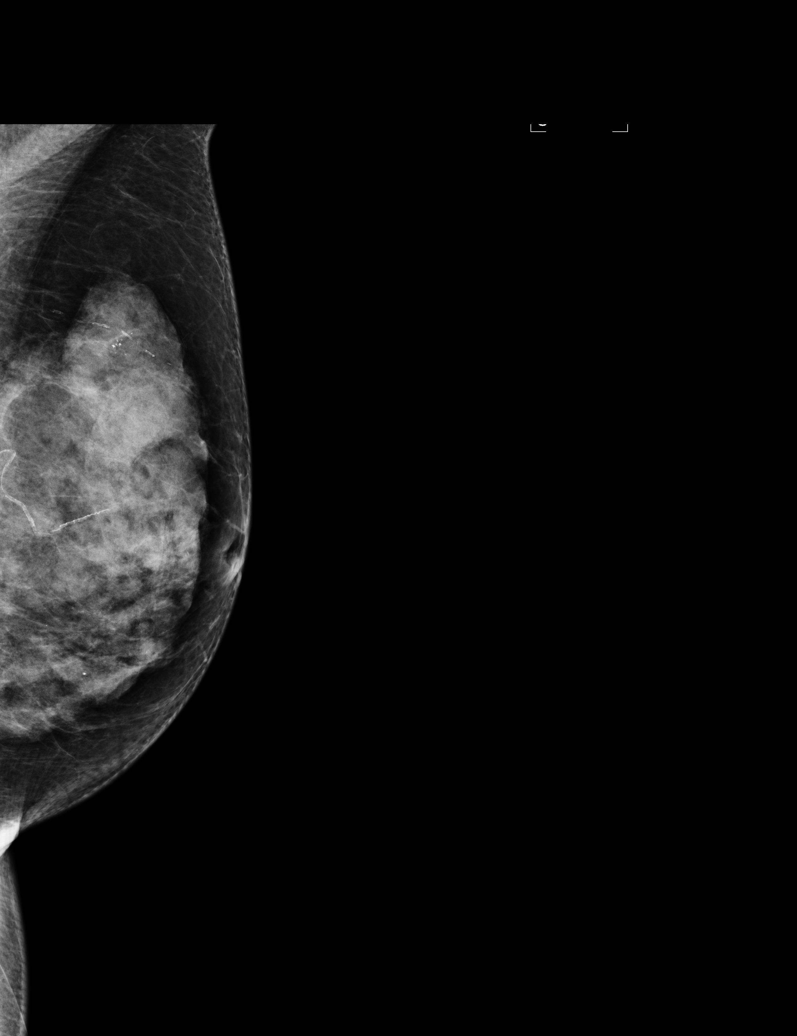

[L ML (1 of 2)]
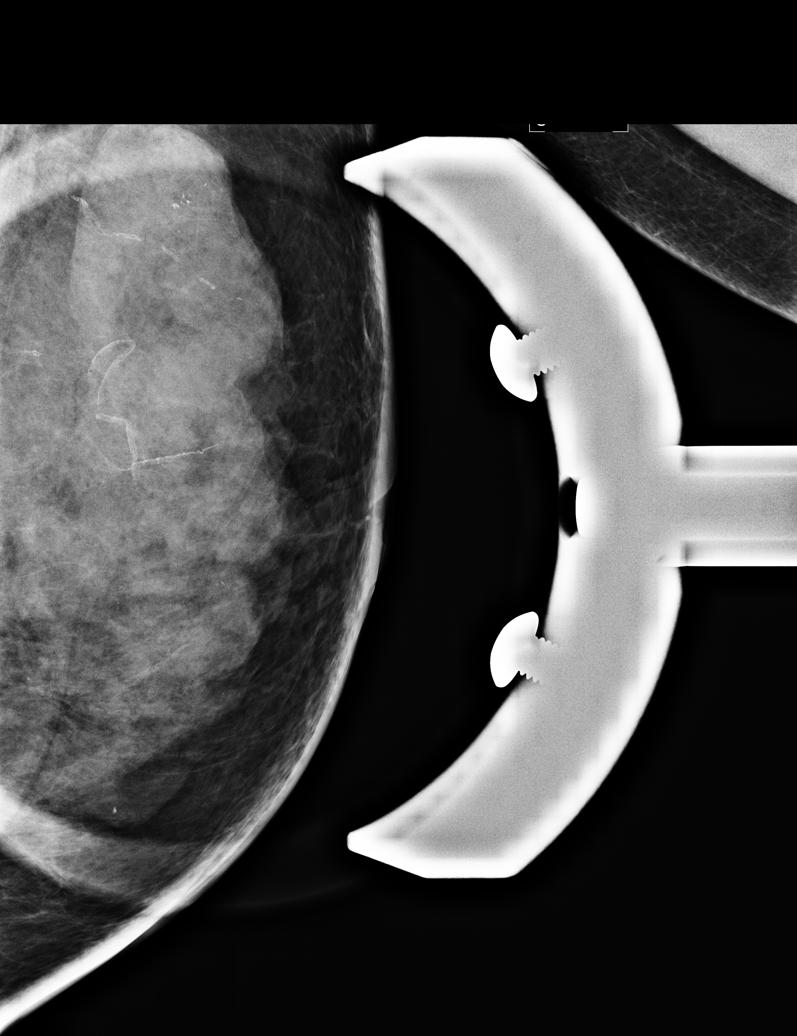

[L CC (2 of 2)]
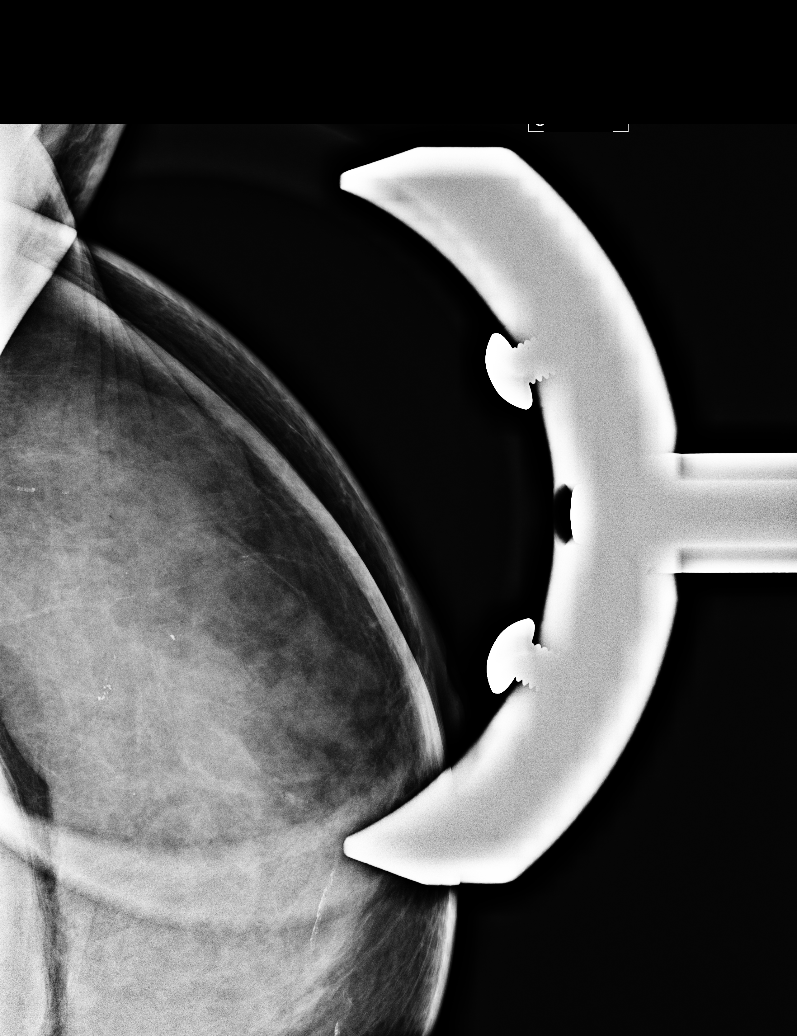

[L ML (2 of 2)]
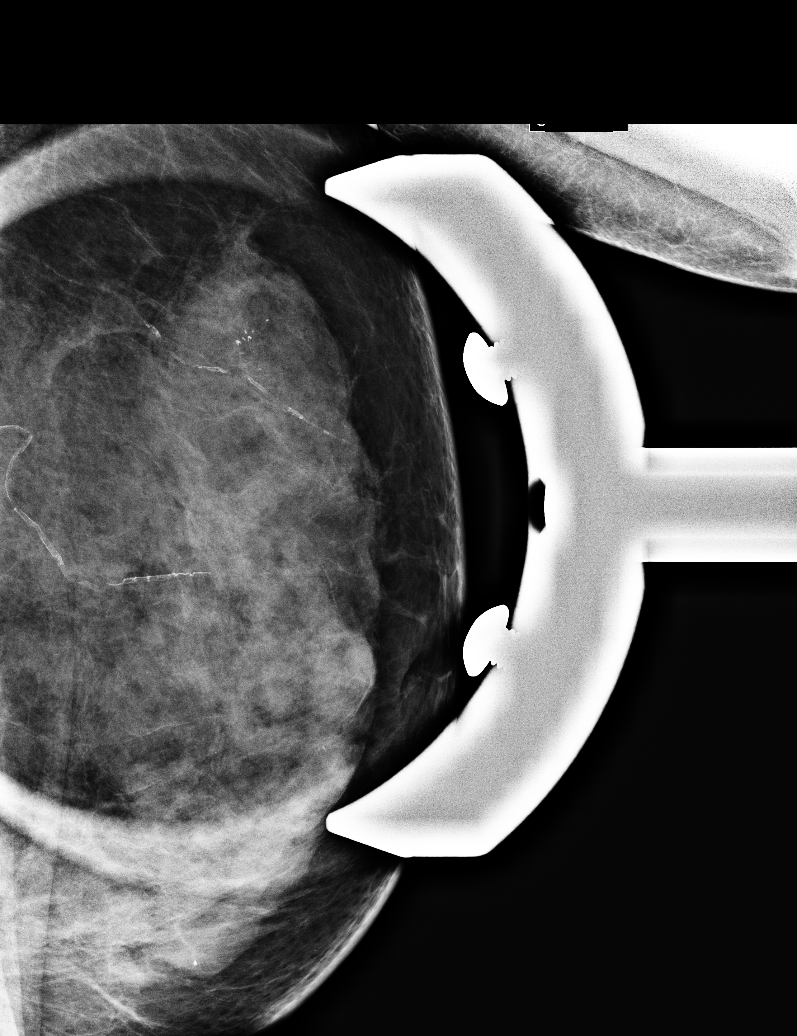

[5 of 5 positions shown; findings below may reference images not displayed]

FINDINGS: ACR Breast Density Category 3: The breast tissue is heterogeneously
dense.

There is no suspicious dominant mass or architectural distortion.
There are clustered calcifications that vary in size and shape in
the left upper outer quadrant spanning approximately 4-5 mm.
Biopsy is suggested.

Mammographic images were processed with CAD.
IMPRESSION: Slightly suspicious calcifications in the left upper outer
quadrant.

RECOMMENDATION:
Biopsy is suggested.  Stereotactic core needle biopsy has been
scheduled for 02/07/2013 at [REDACTED].

I have discussed the findings and recommendations with the patient.
Results were also provided in writing at the conclusion of the
visit.  If applicable, a reminder letter will be sent to the
patient regarding her next appointment.

BI-RADS CATEGORY 4:  Suspicious abnormality - biopsy should be
considered.

## 2013-10-09 IMAGING — MG MM BX 1ST LESION STEREO
3 series · 3 of 3 positions shown · non-contrast
Comparison: Previous exams.

***ADDENDUM*** CREATED: 02/07/2013 [DATE]

Pathology revealed fibroadenomatoid nodules with calcifications in
the left breast. This was found to be concordant by Dr. Jeremy
Donadic. Pathology was relayed to the patient by telephone. The patient
reported doing well after the biopsy. Post biopsy instructions were
reviewed and her questions were answered. She was encouraged to
call The [REDACTED] for any additional
concerns. She was asked to return in Saturday June, 2013 for screening
mammography.
Pathology results are dictated by Kai Reid RN, BSN on February 07, 2013.
***END ADDENDUM*** SIGNED BY: Taisiya Nuraliev, M.D.
CLINICAL DATA: Calcifications left breast for biopsy.
STEREOTACTIC-GUIDED VACUUM ASSISTED BIOPSY OF THE LEFT BREAST AND
SPECIMEN RADIOGRAPH

[L SPECIMEN]
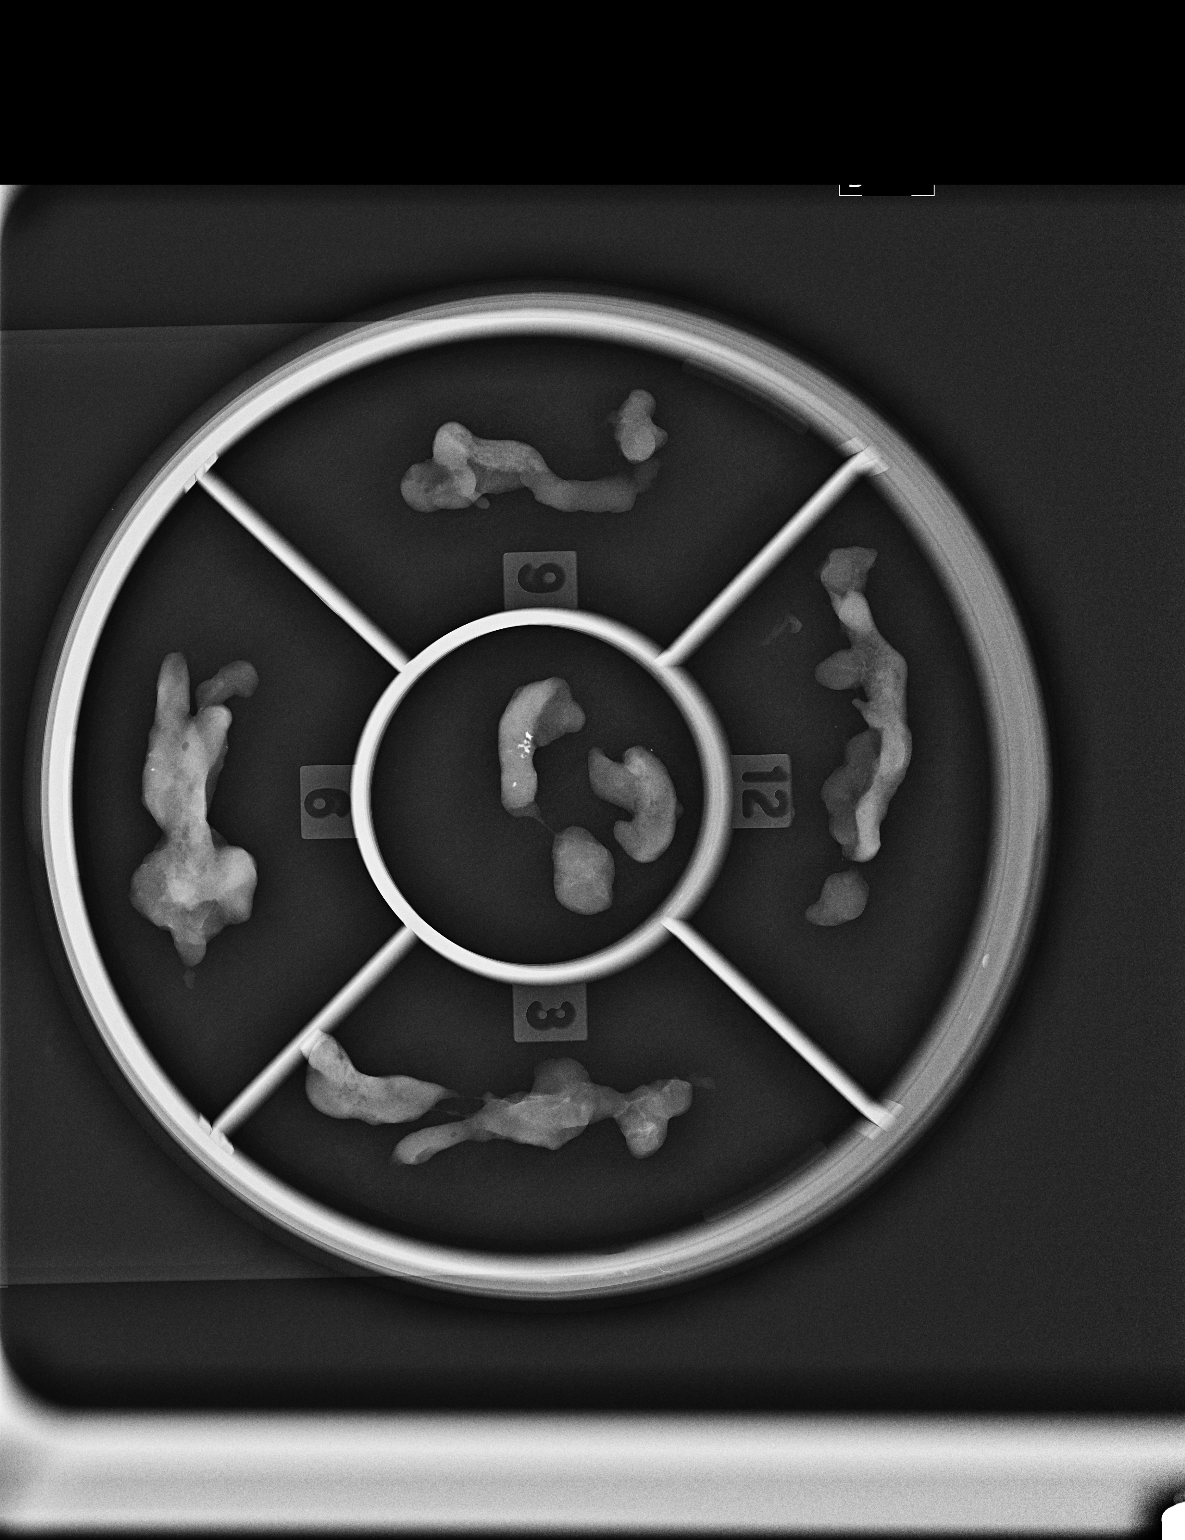

[L CC]
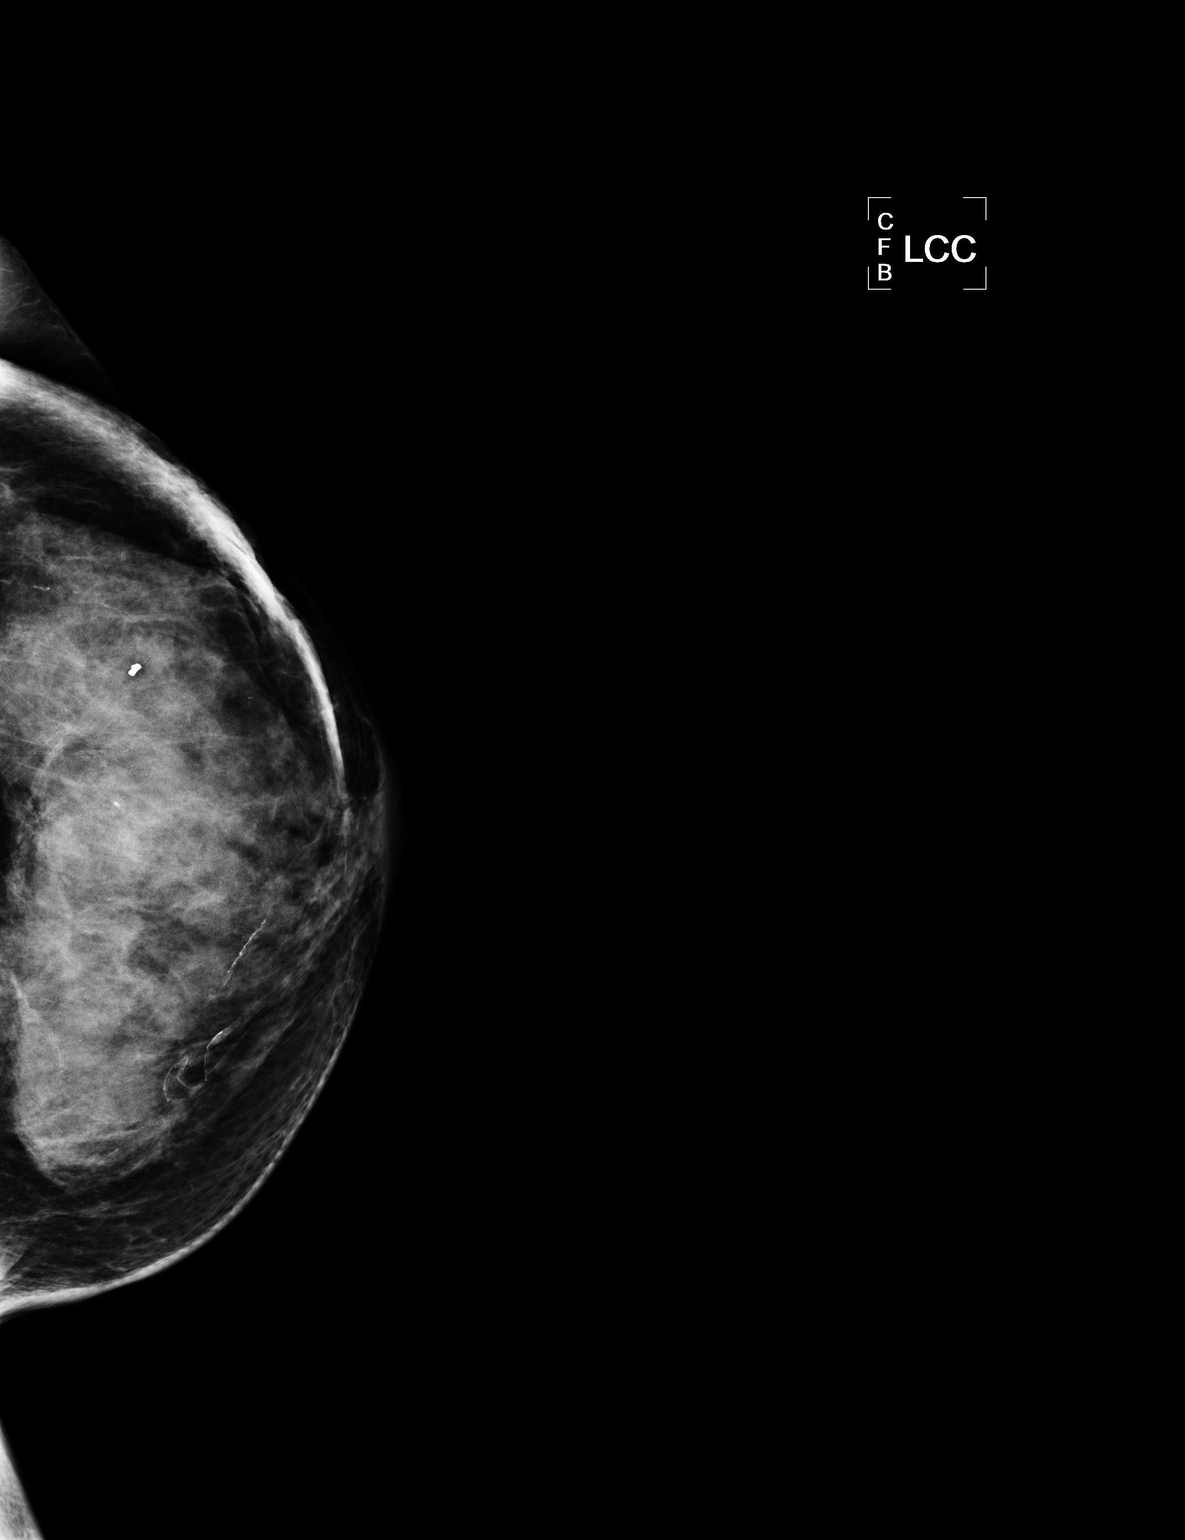

[L ML]
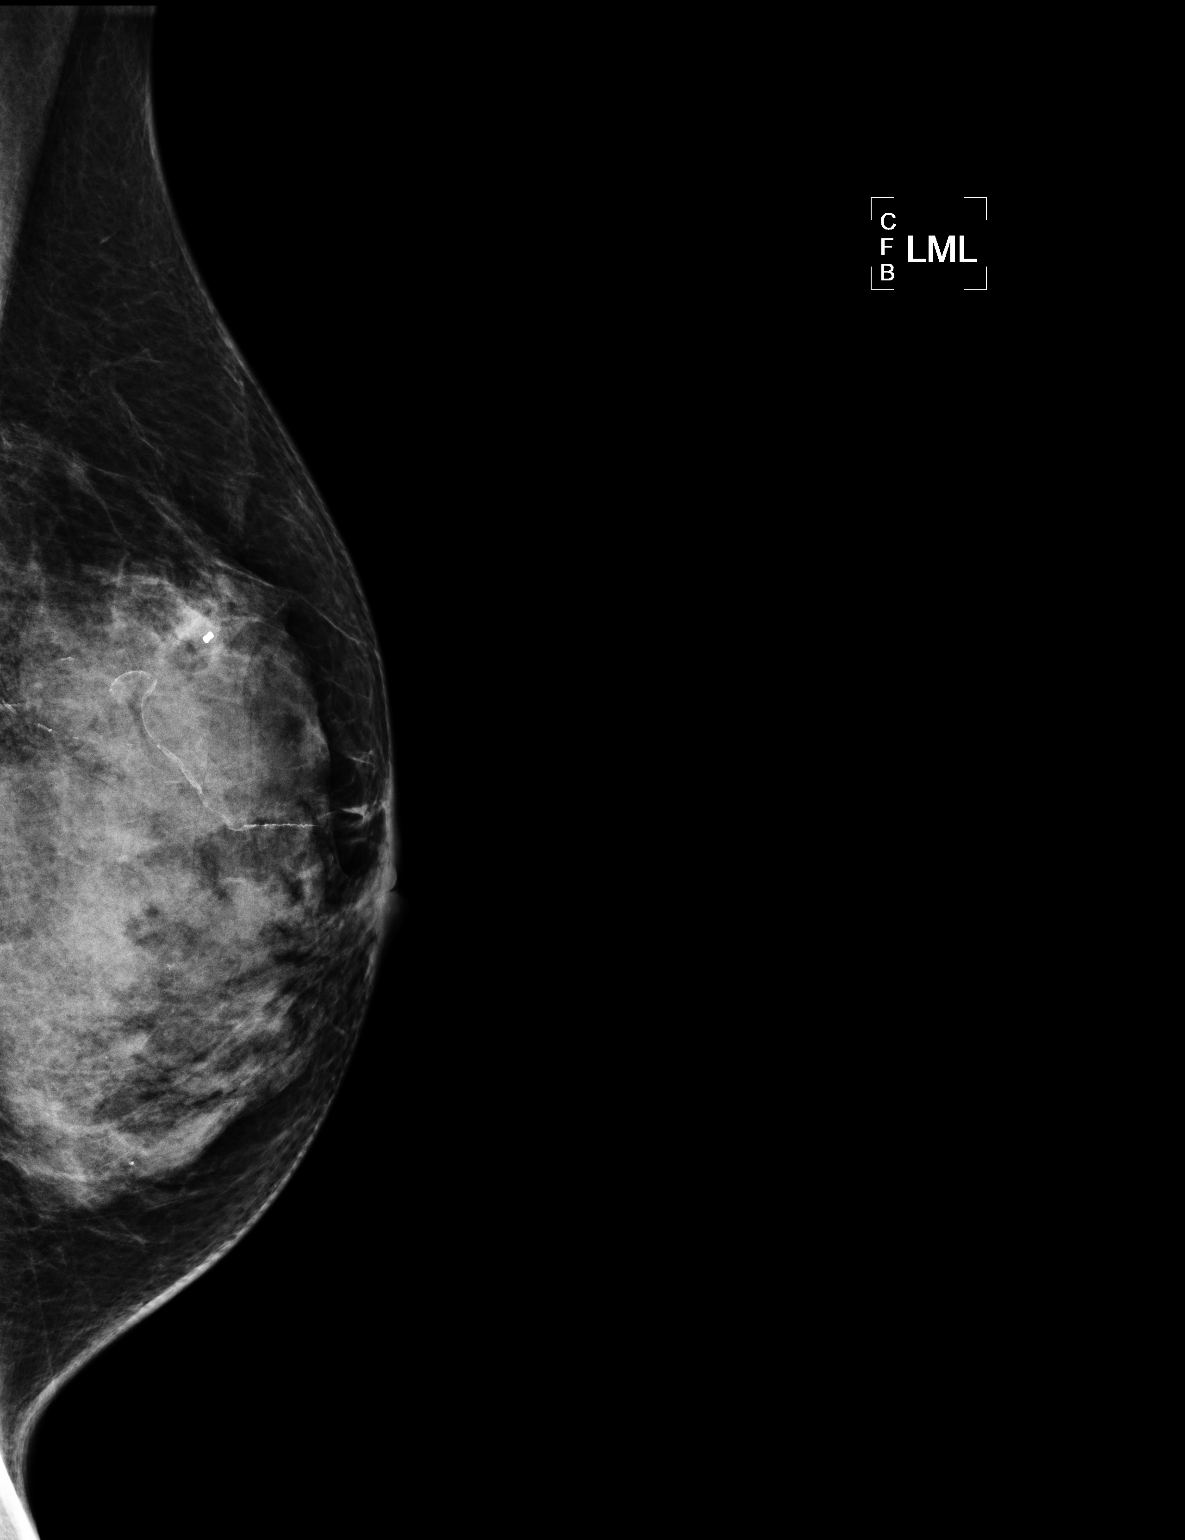

[3 of 3 positions shown; findings below may reference images not displayed]

I met with the patient and we discussed the procedure of
stereotactic-guided biopsy, including benefits and alternatives.
We discussed the high likelihood of a successful procedure. We
discussed the risks of the procedure, including infection,
bleeding, tissue injury, clip migration, and inadequate sampling.
Informed, written consent was given.

Using sterile technique and 2% Lidocaine as local anesthetic, under
stereotactic guidance, a 9 gauge vacuum-assisted device was used to
perform core needle biopsy of a clustered calcifications in the
upper outer quadrant left breast using a lateral approach.
Specimen radiograph was performed, showing inclusion of
calcifications of concern.  Specimens with calcifications are
identified for pathology.

At the conclusion of the procedure, a  tissue marker clip was
deployed into the biopsy cavity.  Follow-up 2-view mammogram
confirmed clip in the area of concern.

The usual time-out protocol was performed immediately prior to the
procedure.
IMPRESSION: Stereotactic-guided biopsy of left breast.  No apparent
complications.

## 2014-03-08 ENCOUNTER — Other Ambulatory Visit (HOSPITAL_COMMUNITY): Payer: Self-pay | Admitting: Physician Assistant

## 2014-03-08 DIAGNOSIS — Z0000 Encounter for general adult medical examination without abnormal findings: Secondary | ICD-10-CM

## 2014-03-17 ENCOUNTER — Other Ambulatory Visit (HOSPITAL_COMMUNITY): Payer: Self-pay | Admitting: Physician Assistant

## 2014-03-17 DIAGNOSIS — Z13820 Encounter for screening for osteoporosis: Secondary | ICD-10-CM

## 2014-03-20 ENCOUNTER — Other Ambulatory Visit (HOSPITAL_COMMUNITY): Payer: Medicare Other

## 2014-04-07 ENCOUNTER — Ambulatory Visit (HOSPITAL_COMMUNITY)
Admission: RE | Admit: 2014-04-07 | Discharge: 2014-04-07 | Disposition: A | Discharge status: Home / Self Care | Payer: Medicare Other | Source: Ambulatory Visit | Attending: Physician Assistant | Admitting: Physician Assistant

## 2014-04-07 DIAGNOSIS — Z13820 Encounter for screening for osteoporosis: Secondary | ICD-10-CM | POA: Diagnosis not present

## 2014-11-15 ENCOUNTER — Other Ambulatory Visit: Payer: Self-pay

## 2014-11-15 DIAGNOSIS — Z1231 Encounter for screening mammogram for malignant neoplasm of breast: Secondary | ICD-10-CM

## 2014-11-29 ENCOUNTER — Ambulatory Visit
Admission: RE | Admit: 2014-11-29 | Discharge: 2014-11-29 | Disposition: A | Discharge status: Home / Self Care | Payer: Commercial Managed Care - HMO | Source: Ambulatory Visit

## 2014-11-29 DIAGNOSIS — Z1231 Encounter for screening mammogram for malignant neoplasm of breast: Secondary | ICD-10-CM

## 2020-11-27 ENCOUNTER — Encounter: Payer: Self-pay | Admitting: Internal Medicine
